# Patient Record
Sex: Female | Born: 1970 | Race: White | Hispanic: No | Marital: Married | Smoking: Never smoker
Health system: Southern US, Community
[De-identification: ages and names within clinical notes are randomized; demographics above are authoritative.]

## PROBLEM LIST (undated history)

## (undated) DIAGNOSIS — E119 Type 2 diabetes mellitus without complications: Secondary | ICD-10-CM

## (undated) DIAGNOSIS — E039 Hypothyroidism, unspecified: Secondary | ICD-10-CM

## (undated) DIAGNOSIS — K37 Unspecified appendicitis: Secondary | ICD-10-CM

## (undated) DIAGNOSIS — T7840XA Allergy, unspecified, initial encounter: Secondary | ICD-10-CM

## (undated) DIAGNOSIS — E310 Autoimmune polyglandular failure: Secondary | ICD-10-CM

## (undated) DIAGNOSIS — E559 Vitamin D deficiency, unspecified: Secondary | ICD-10-CM

## (undated) DIAGNOSIS — E785 Hyperlipidemia, unspecified: Secondary | ICD-10-CM

## (undated) HISTORY — DX: Unspecified appendicitis: K37

## (undated) HISTORY — DX: Autoimmune polyglandular failure: E31.0

## (undated) HISTORY — PX: ENDOMETRIAL ABLATION: SHX621

## (undated) HISTORY — DX: Vitamin D deficiency, unspecified: E55.9

## (undated) HISTORY — PX: LAPAROSCOPY: SHX197

## (undated) HISTORY — DX: Allergy, unspecified, initial encounter: T78.40XA

## (undated) HISTORY — DX: Type 2 diabetes mellitus without complications: E11.9

## (undated) HISTORY — DX: Hyperlipidemia, unspecified: E78.5

## (undated) HISTORY — DX: Hypothyroidism, unspecified: E03.9

---

## 1998-08-17 ENCOUNTER — Emergency Department (HOSPITAL_COMMUNITY): Admission: EM | Admit: 1998-08-17 | Discharge: 1998-08-17 | Payer: Self-pay | Admitting: Emergency Medicine

## 1998-08-17 ENCOUNTER — Encounter: Payer: Self-pay | Admitting: Emergency Medicine

## 1998-10-05 ENCOUNTER — Other Ambulatory Visit: Admission: RE | Admit: 1998-10-05 | Discharge: 1998-10-05 | Payer: Self-pay | Admitting: *Deleted

## 1999-10-11 ENCOUNTER — Other Ambulatory Visit: Admission: RE | Admit: 1999-10-11 | Discharge: 1999-10-11 | Payer: Self-pay | Admitting: *Deleted

## 2000-08-10 ENCOUNTER — Encounter (INDEPENDENT_AMBULATORY_CARE_PROVIDER_SITE_OTHER): Payer: Self-pay

## 2000-08-10 ENCOUNTER — Other Ambulatory Visit: Admission: RE | Admit: 2000-08-10 | Discharge: 2000-08-10 | Payer: Self-pay | Admitting: *Deleted

## 2000-10-11 ENCOUNTER — Other Ambulatory Visit: Admission: RE | Admit: 2000-10-11 | Discharge: 2000-10-11 | Payer: Self-pay | Admitting: *Deleted

## 2000-12-28 ENCOUNTER — Other Ambulatory Visit: Admission: RE | Admit: 2000-12-28 | Discharge: 2000-12-28 | Payer: Self-pay | Admitting: *Deleted

## 2001-10-15 ENCOUNTER — Other Ambulatory Visit: Admission: RE | Admit: 2001-10-15 | Discharge: 2001-10-15 | Payer: Self-pay | Admitting: *Deleted

## 2002-12-17 ENCOUNTER — Encounter: Admission: RE | Admit: 2002-12-17 | Discharge: 2002-12-17 | Payer: Self-pay | Admitting: Obstetrics and Gynecology

## 2003-03-01 ENCOUNTER — Inpatient Hospital Stay (HOSPITAL_COMMUNITY): Admission: AD | Admit: 2003-03-01 | Discharge: 2003-03-03 | Payer: Self-pay | Admitting: Obstetrics and Gynecology

## 2003-03-30 ENCOUNTER — Other Ambulatory Visit: Admission: RE | Admit: 2003-03-30 | Discharge: 2003-03-30 | Payer: Self-pay | Admitting: Obstetrics & Gynecology

## 2004-07-25 ENCOUNTER — Other Ambulatory Visit: Admission: RE | Admit: 2004-07-25 | Discharge: 2004-07-25 | Payer: Self-pay | Admitting: *Deleted

## 2005-09-11 ENCOUNTER — Other Ambulatory Visit: Admission: RE | Admit: 2005-09-11 | Discharge: 2005-09-11 | Payer: Self-pay | Admitting: *Deleted

## 2007-04-09 ENCOUNTER — Inpatient Hospital Stay (HOSPITAL_COMMUNITY): Admission: AD | Admit: 2007-04-09 | Discharge: 2007-04-12 | Payer: Self-pay | Admitting: Obstetrics and Gynecology

## 2007-06-13 ENCOUNTER — Ambulatory Visit (HOSPITAL_COMMUNITY): Admission: RE | Admit: 2007-06-13 | Discharge: 2007-06-13 | Payer: Self-pay | Admitting: Family Medicine

## 2007-06-14 ENCOUNTER — Inpatient Hospital Stay (HOSPITAL_COMMUNITY): Admission: EM | Admit: 2007-06-14 | Discharge: 2007-06-17 | Payer: Self-pay | Admitting: Emergency Medicine

## 2007-07-17 HISTORY — PX: APPENDECTOMY: SHX54

## 2007-08-09 ENCOUNTER — Encounter (INDEPENDENT_AMBULATORY_CARE_PROVIDER_SITE_OTHER): Payer: Self-pay | Admitting: Surgery

## 2007-08-09 ENCOUNTER — Ambulatory Visit (HOSPITAL_COMMUNITY): Admission: RE | Admit: 2007-08-09 | Discharge: 2007-08-10 | Payer: Self-pay | Admitting: Surgery

## 2007-08-30 IMAGING — CT CT ABCESS DRAINAGE
1 of 3 series · 12 of 32 positions shown, 18 images · non-contrast
Comparison: CT of the abdomen and pelvis of 06/13/07.

CLINICAL DATA: Perforated appendicitis with abscess located in the deep midline pelvis within the peritoneal cavity.  The patient now requires percutaneous drainage.
 CT GUIDED DRAINAGE OF PERITONEAL ABSCESS:

[Series 2: pel 5.0 b40f st · axial · 0.78mm/px · z∈[-318,-208]mm · 12 of 26 slices shown, 18 images]
[im 2/26  soft-tissue]
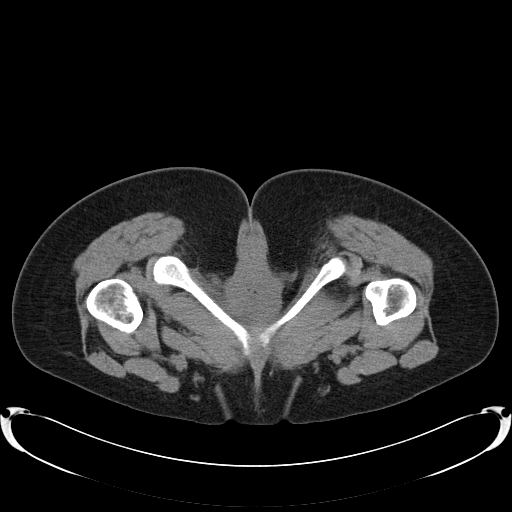
[im 2/26  bone]
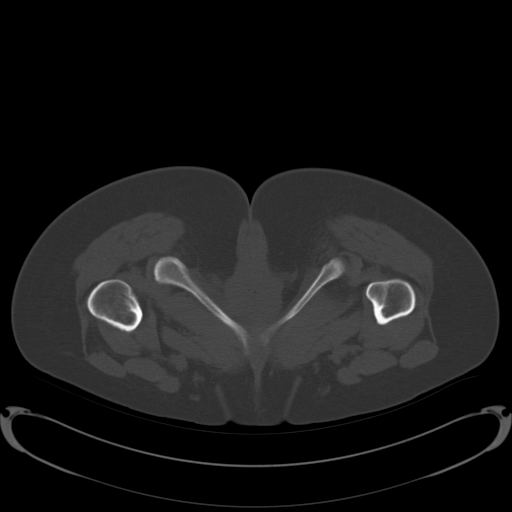
[im 4/26  soft-tissue]
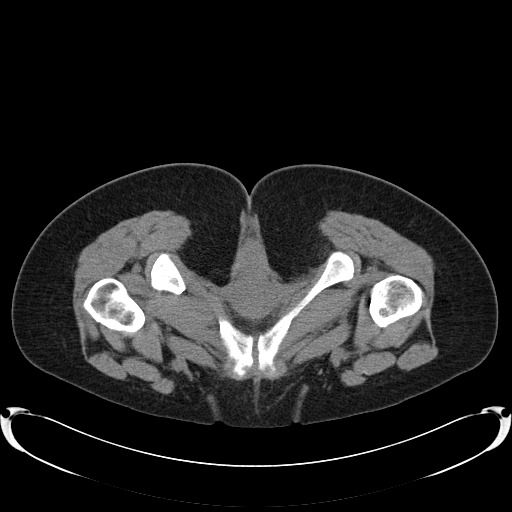
[im 6/26  soft-tissue]
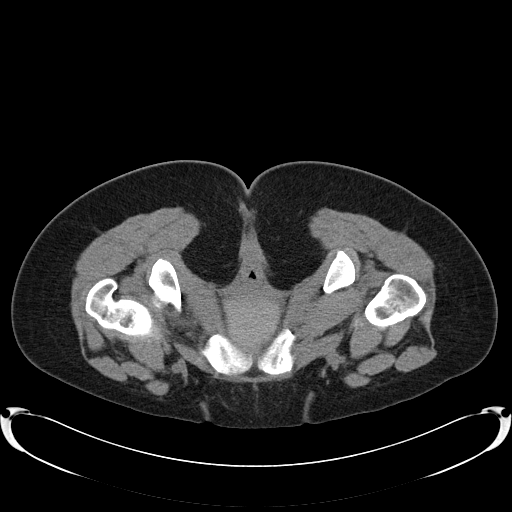
[im 8/26  soft-tissue]
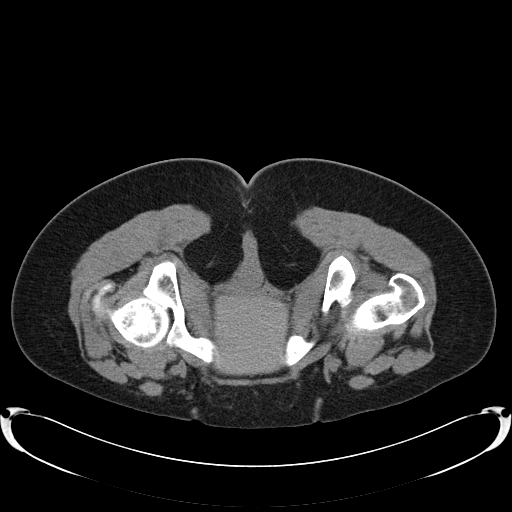
[im 10/26  soft-tissue]
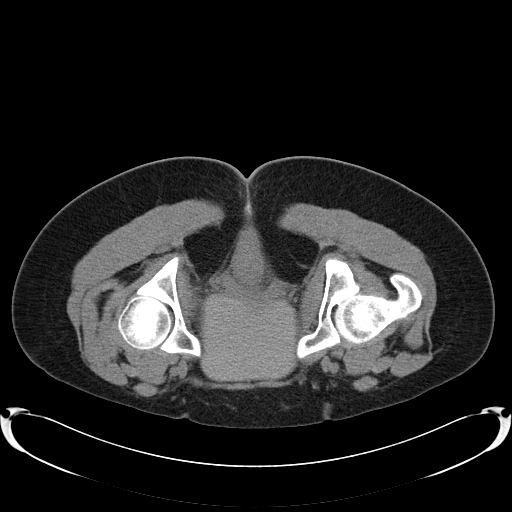
[im 12/26  soft-tissue]
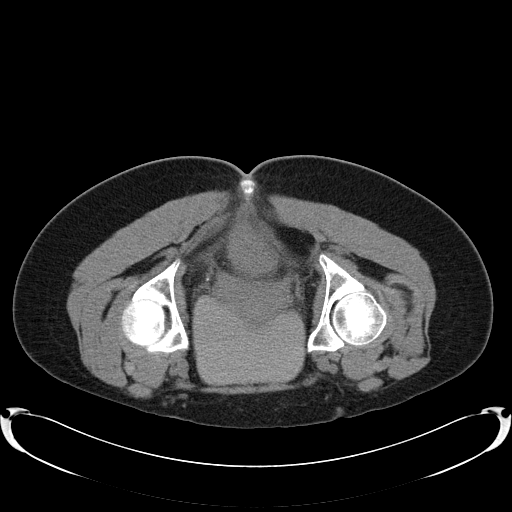
[im 14/26  soft-tissue]
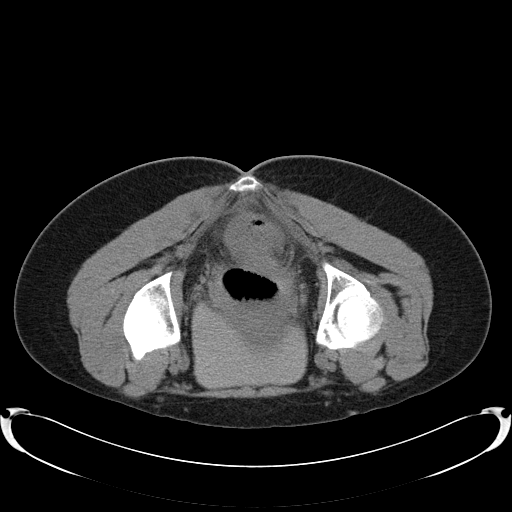
[im 16/26  soft-tissue]
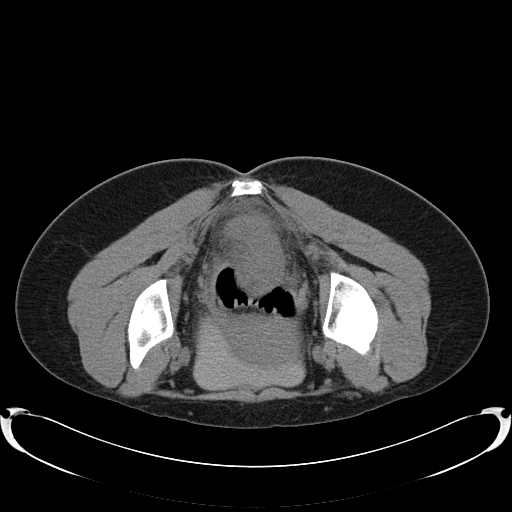
[im 18/26  soft-tissue]
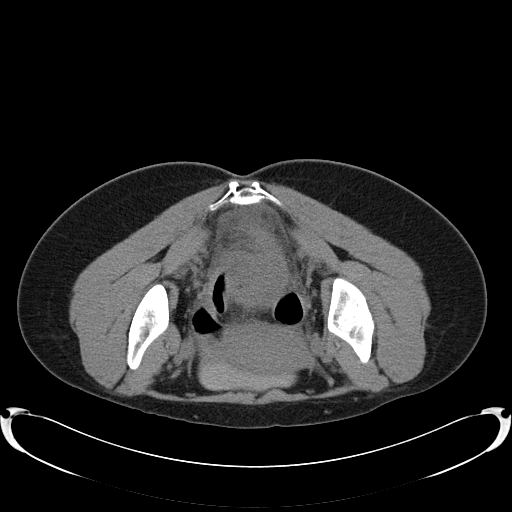
[im 18/26  lung]
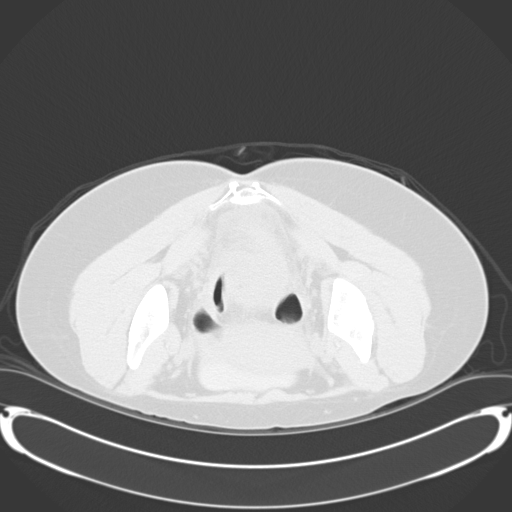
[im 18/26  bone]
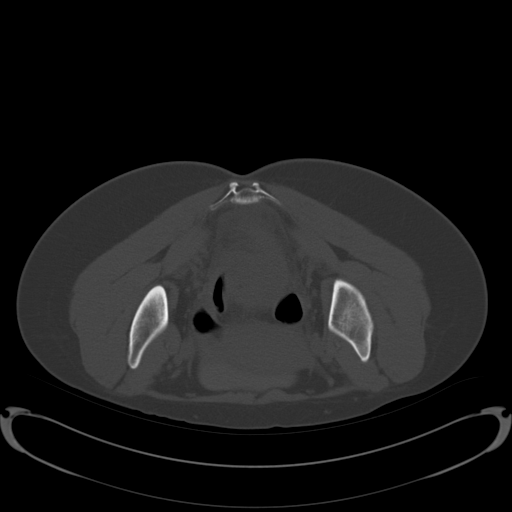
[im 20/26  soft-tissue]
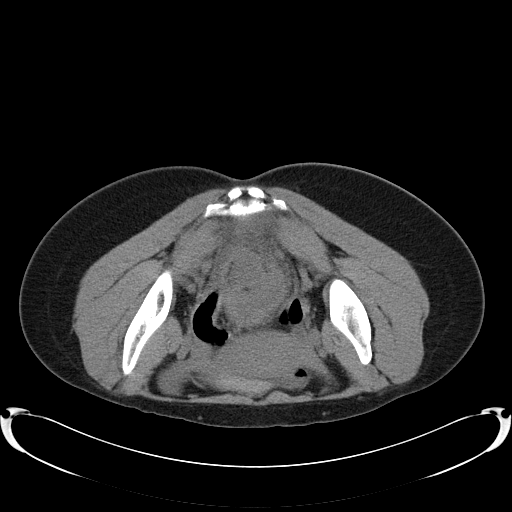
[im 20/26  lung]
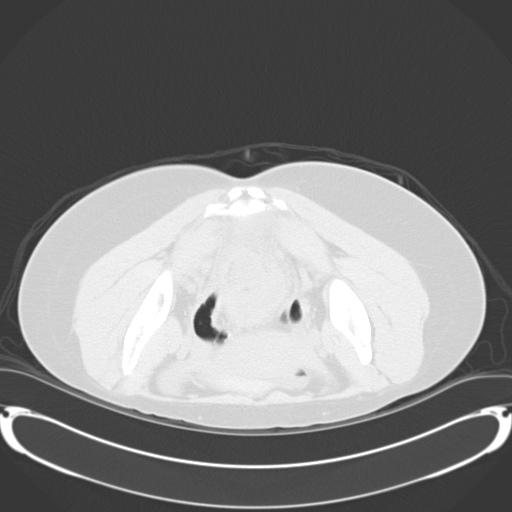
[im 22/26  soft-tissue]
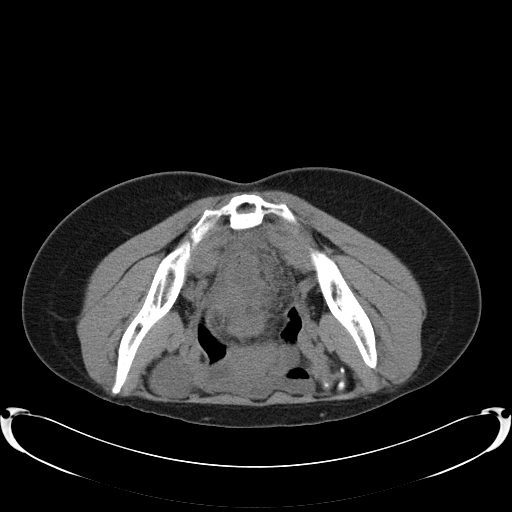
[im 22/26  lung]
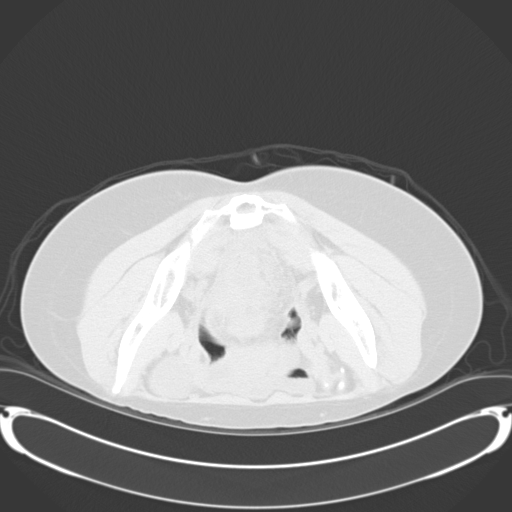
[im 24/26  soft-tissue]
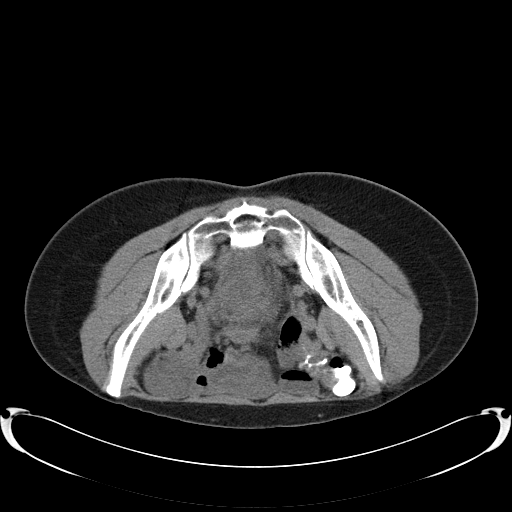
[im 24/26  lung]
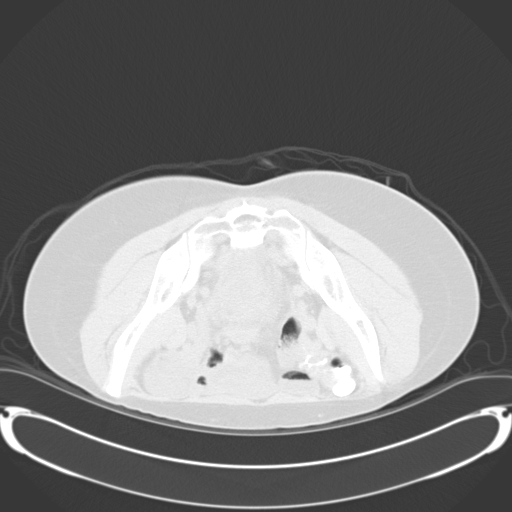

[12 of 32 positions shown; findings below may reference images not displayed]

Consent:  Prior to the procedure, informed consent was obtained.
 Sedation:  4 mg IV Versed, 100 mcg IV Fentanyl.
 Total Monitored Sedation Time:  45 minutes.
 Procedure:  The patient was placed in a prone position.  Preliminary unenhanced CT was performed through the pelvis.  The left transgluteal region was chosen for access and the skin sterilely prepped and draped.  Local anesthesia was provided with 1% lidocaine.  
 An 18-gauge trocar needle was advanced in an oblique fashion to the deep midline pelvic abscess.  After confirming needle tip position, aspiration was performed and a sample of liquid collected for culture studies.  A guidewire was then advanced through the needle and into the collection.  Over the guidewire, the tract was dilated to 12-French, and a 12-French pigtail abscess drainage catheter placed.  Final catheter position was confirmed by CT.  The catheter was flushed with saline and connected to Kiri Jim suction bulb.  It was secured at the skin with a Prolene retention suture and adhesive retention device.
 Complications:  None.
FINDINGS: Left transgluteal window was present just adjacent to the lower sacrum to gain access into the lower portion of the abscess cavity anterior to the rectum.  After needle placement, aspiration yielded grossly purulent fluid.  A sample was sent for culture studies. The 12-French catheter was able to be formed directly in the midline collection.  Output after catheter placement was bloody.  The tube will be flushed every eight hours and output followed.
IMPRESSION: CT guided drainage of deep peritoneal abscess located in the lower midline pelvis.  A sample of grossly purulent fluid was sent for culture studies.  A 12-French drain was placed into the collection.

## 2008-07-09 ENCOUNTER — Other Ambulatory Visit: Admission: RE | Admit: 2008-07-09 | Discharge: 2008-07-09 | Payer: Self-pay | Admitting: Gynecology

## 2011-02-28 NOTE — Op Note (Signed)
Veronica Hale, Veronica Hale               ACCOUNT NO.:  [id number]   MEDICAL RECORD NO.:  [id number]          PATIENT TYPE:  OIB   LOCATION:  1535                         FACILITY:  [facility]   PHYSICIAN:  Thornton Park. Daphine Deutscher, MD  DATE OF BIRTH:  [birth year]   DATE OF PROCEDURE:  08/09/2007  DATE OF DISCHARGE:  08/10/2007                               OPERATIVE REPORT   CCS NUMBER:  [id number].   PREOPERATIVE DIAGNOSIS:  Veronica Hale is a 40 year old white female who had  suffered a perforated appendix requiring percutaneous drainage in August  2008.  She is brought at this time for interval appendectomy.   PROCEDURE:  Laparoscopic appendectomy for perforated appendix.   SURGEON:  Thornton Park. Daphine Deutscher, MD   ANESTHESIA:  General endotracheal.   DESCRIPTION OF PROCEDURE:  The patient was taken to room #10 in [facility] and given general anesthesia.  Preoperatively she received a bowel  prep and she had Mefoxin.  I put a standard Hasson through the  umbilicus, a 10/11 through the left lower quadrant and a 5 in the right  upper quadrant.  The appendix was long and was stuck down into the  retroperitoneum on the right side.  I teased that away and separated it,  and came apart and obviously in the part of the perforation the abscess  was, and I dug out the tip from the retroperitoneum and teased that away  and put it in a bag and removed it first and then I isolated the base of  the appendix, went across it with an Endo-GIA vascular load and then I  went through the mesentery with a harmonic scalpel.  There was one  bleeder at the base the appendix, which was controlled with couple of  Endoclips, and I irrigated and no bleeding was noted.  I then repaired  the umbilical defect with three sutures of 0 Vicryl and then injected  all the ports with Marcaine and deflated the abdomen and closed the  wounds with 4-0 Vicryl with Benzoin and Steri-Strips.  The patient  tolerated the procedure well and was taken to the  recovery room in  satisfactory condition.      Thornton Park Daphine Deutscher, MD  Electronically Signed     MBM/MEDQ  D:  08/09/2007  T:  08/10/2007  Job:  [id number]   cc:   Alfonse Alpers. Dagoberto Ligas, M.D.  Fax: [phone]   Almedia Balls. Randell Patient, M.D.  Fax: [phone]

## 2011-03-03 NOTE — Discharge Summary (Signed)
Veronica Hale, Veronica Hale               ACCOUNT NO.:  [id number]   MEDICAL RECORD NO.:  [id number]          PATIENT TYPE:  INP   LOCATION:  1408                         FACILITY:  [facility]   PHYSICIAN:  Thornton Park. Daphine Deutscher, MD  DATE OF BIRTH:  [birth year]   DATE OF ADMISSION:  06/14/2007  DATE OF DISCHARGE:  06/17/2007                               DISCHARGE SUMMARY   CHIEF COMPLAINT:  Ruptured appendix with abscess   PROCEDURE:  Percutaneous drainage CT guidance the drains placed for  flushes q.8 h.   HISTORY:  This  is a 40 year old white female who is breast feeding and  is two months status post vaginal delivery who came with a 2-week  history of not feeling well and abdominal pain.  She is on an insulin  pump managed by Dr. Anson Fret and presented to the emergency room  febrile and with the above-mentioned CT scan.   COURSE IN HOSPITAL:  The patient underwent drainage by interventional  radiology, and she improved.  Diabetes coordinator saw her.  Temperature  gradually went down to normal. White count came down. Drainage  decreased.  Abdomen was benign, minimal Jackson-Pratt drainage.  Drainage was serosanguineous at the time of discharge home on Augmentin.  Follow up with me in 1-2 weeks.   FINAL DIAGNOSIS:  Perforated appendicitis.      Thornton Park Daphine Deutscher, MD  Electronically Signed     MBM/MEDQ  D:  07/16/2007  T:  07/16/2007  Job:  [id number]   cc:   Alfonse Alpers. Dagoberto Ligas, M.D.  Fax: [phone]

## 2011-07-28 LAB — URINALYSIS, ROUTINE W REFLEX MICROSCOPIC
Glucose, UA: NEGATIVE
Hgb urine dipstick: NEGATIVE
Ketones, ur: >80 — AB
Leukocytes, UA: NEGATIVE
Protein, ur: 30 — AB
Urobilinogen, UA: 0.2

## 2011-07-28 LAB — BASIC METABOLIC PANEL
BUN: 4 — ABNORMAL LOW
BUN: 6
Creatinine, Ser: 0.56
Creatinine, Ser: 0.98
GFR calc Af Amer: >60
GFR calc Af Amer: >60
GFR calc non Af Amer: >60
GFR calc non Af Amer: >60
Potassium: 3.7
Potassium: 4.3

## 2011-07-28 LAB — COMPREHENSIVE METABOLIC PANEL
ALT: 18
AST: 18
Albumin: 2.7 — ABNORMAL LOW
BUN: 7
CO2: 16 — ABNORMAL LOW
Chloride: 99
GFR calc non Af Amer: >60
Glucose, Bld: 117 — ABNORMAL HIGH
Potassium: 3.2 — ABNORMAL LOW

## 2011-07-28 LAB — CBC
HCT: 32.5 — ABNORMAL LOW
Hemoglobin: 11.2 — ABNORMAL LOW
Platelets: 422 — ABNORMAL HIGH
WBC: 13.3 — ABNORMAL HIGH
WBC: 8

## 2011-07-28 LAB — CULTURE, ROUTINE-ABSCESS

## 2011-07-28 LAB — DIFFERENTIAL
Eosinophils Absolute: 0
Eosinophils Relative: 0
Lymphocytes Relative: 7 — ABNORMAL LOW
Lymphs Abs: 1
Monocytes Absolute: 1.2 — ABNORMAL HIGH
Monocytes Relative: 9

## 2011-07-28 LAB — CULTURE, BLOOD (ROUTINE X 2)

## 2011-07-28 LAB — KETONES, QUALITATIVE

## 2011-07-28 LAB — KETONES, URINE: Ketones, ur: >80 — AB

## 2011-07-28 LAB — ANAEROBIC CULTURE

## 2011-08-02 LAB — CBC
HCT: 33.4 — ABNORMAL LOW
HCT: 37
Hemoglobin: 11.1 — ABNORMAL LOW
Hemoglobin: 12.3
MCV: 79.8
RBC: 4.19
RBC: 4.66
WBC: 12.8 — ABNORMAL HIGH
WBC: 8.8

## 2011-08-02 LAB — DIFFERENTIAL
Eosinophils Relative: 1
Lymphocytes Relative: 17
Lymphs Abs: 1.5
Monocytes Absolute: 0.7
Monocytes Relative: 8

## 2011-08-02 LAB — RAPID HIV SCREEN (WH-MAU): Rapid HIV Screen: NONREACTIVE

## 2015-03-04 ENCOUNTER — Other Ambulatory Visit: Payer: Self-pay | Admitting: Neurological Surgery

## 2015-03-04 DIAGNOSIS — M5412 Radiculopathy, cervical region: Secondary | ICD-10-CM

## 2015-03-08 ENCOUNTER — Ambulatory Visit
Admission: RE | Admit: 2015-03-08 | Discharge: 2015-03-08 | Disposition: A | Discharge status: Home / Self Care | Payer: 59 | Source: Ambulatory Visit | Attending: Neurological Surgery | Admitting: Neurological Surgery

## 2015-03-08 DIAGNOSIS — M5412 Radiculopathy, cervical region: Secondary | ICD-10-CM

## 2015-03-08 MED ORDER — TRIAMCINOLONE ACETONIDE 40 MG/ML IJ SUSP (RADIOLOGY)
40.0000 mg | Freq: Once | INTRAMUSCULAR | Status: AC
  Administered 2015-03-08: 40 mg via EPIDURAL

## 2015-03-08 MED ORDER — IOHEXOL 300 MG/ML  SOLN
1.0000 mL | Freq: Once | INTRAMUSCULAR | Status: AC | PRN
  Administered 2015-03-08: 1 mL via EPIDURAL

## 2015-03-08 NOTE — Discharge Instructions (Signed)

## 2015-07-13 ENCOUNTER — Ambulatory Visit (INDEPENDENT_AMBULATORY_CARE_PROVIDER_SITE_OTHER): Payer: Commercial Managed Care - HMO | Admitting: Urgent Care

## 2015-07-13 VITALS — BP 108/76 | HR 69 | Temp 98.6°F | Resp 18 | Ht 63.0 in | Wt 141.0 lb

## 2015-07-13 DIAGNOSIS — Z111 Encounter for screening for respiratory tuberculosis: Secondary | ICD-10-CM | POA: Diagnosis not present

## 2015-07-13 NOTE — Patient Instructions (Addendum)
- Please return within 48-72 hours for your reading. Otherwise it is not valid.  Tuberculin Skin Test The PPD skin test is a method used to help with the diagnosis of a disease called tuberculosis (TB). HOW THE TEST IS DONE  The test site (usually the forearm) is cleansed. The PPD extract is then injected under the top layer of skin, causing a blister to form on the skin. The reaction will take 48 - 72 hours to develop. You must return to your health care Veronica Hale within that time to have the area checked. This will determine whether you have had a significant reaction to the PPD test. A reaction is measured in millimeters of hard swelling (induration) at the site. PREPARATION FOR TEST  There is no special preparation for this test. People with a skin rash or other skin irritations on their arms may need to have the test performed at a different spot on the body. Tell your health care Veronica Hale if you have ever had a positive PPD skin test. If so, you should not have a repeat PPD test. Tell your doctor if you have a medical condition or if you take certain drugs, such as steroids, that can affect your immune system. These situations may lead to inaccurate test results. NORMAL FINDINGS A negative reaction (no induration) or a level of hard swelling that falls below a certain cutoff may mean that a person has not been infected with the bacteria that cause TB. There are different cutoffs for children, people with HIV, and other risk groups. Unfortunately, this is not a perfect test, and up to 20% of people infected with tuberculosis may not have a reaction on the PPD skin test. In addition, certain conditions that affect the immune system (cancer, recent chemotherapy, late-stage AIDS) may cause a false-negative test result.  The reaction will take 48 - 72 hours to develop. You must return to your health care Veronica Hale within that time to have the area checked. Follow your caregiver's instructions as to  where and when to report for this to be done. Ranges for normal findings may vary among different laboratories and hospitals. You should always check with your doctor after having lab work or other tests done to discuss the meaning of your test results and whether your values are considered within normal limits. WHAT ABNORMAL RESULTS MEAN  The results of the test depend on the size of the skin reaction and on the person being tested.  A small reaction (5 mm of hard swelling at the site) is considered to be positive in people who have HIV, who are taking steroid therapy, or who have been in close contact with a person who has active tuberculosis. Larger reactions (greater than or equal to 10 mm) are considered positive in people with diabetes or kidney failure, and in health care workers, among others. In people with no known risks for tuberculosis, a positive reaction requires 15 mm or more of hard swelling at the site. RISKS AND COMPLICATIONS There is a very small risk of severe redness and swelling of the arm in people who have had a previous positive PPD test and who have the test again. There also have been a few rare cases of this reaction in people who have not been tested before. CONSIDERATIONS  A positive skin test does not necessarily mean that a person has active tuberculosis. More tests will be done to check whether active disease is present. Many people who were born outside the [place] may have had a vaccine called "BCG," which can lead to a false-positive test result. MEANING OF TEST  Your caregiver will go over the test results with you and discuss the importance and meaning of your results, as well as treatment options and the need for additional tests if necessary. OBTAINING THE TEST RESULTS It is your responsibility to obtain your test results. Ask the lab or department performing the test when and how you will get your results. Document Released: 07/12/2005 Document Revised:  12/25/2011 Document Reviewed: 01/09/2014 [facility] Patient Information 2015 [place], [place]. This information is not intended to replace advice given to you by your health care Veronica Hale. Make sure you discuss any questions you have with your health care Veronica Hale.

## 2015-07-13 NOTE — Progress Notes (Signed)
    MRN: [id number] DOB: Jan 31, 1971  Subjective:   Veronica Hale is a 44 y.o. female presenting for chief complaint of TB Skin Test  Patient is planning on becoming a substitute teacher and is required to have a tuberculosis screen with PPD test. She has never tested positive for a PPD test. Has very low risk factors as seen on her tuberculosis screening questionnaire. She plans on having her form filled out by her physician who did an exam with her. He would like to have these results printed out on Friday. Denies any other aggravating or relieving factors, no other questions or concerns.  Veronica Hale has a current medication list which includes the following prescription(s): atorvastatin and insulin pump. Also is allergic to sulfa antibiotics.  Veronica Hale  has a past medical history of Allergy and Diabetes mellitus without complication. Also  has past surgical history that includes Appendectomy.  Objective:   Vitals: BP 108/76 mmHg  Pulse 69  Temp(Src) 98.6 F (37 C)  Resp 18  Ht  (1.6 m)  Wt 141 lb (63.957 kg)  BMI 24.98 kg/m2  SpO2 98%  LMP 07/05/2015 (Approximate)  Physical Exam  Constitutional: She is oriented to person, place, and time. She appears well-developed and well-nourished.  Cardiovascular: Normal rate.   Pulmonary/Chest: Effort normal.  Neurological: She is alert and oriented to person, place, and time.  Psychiatric: She has a normal mood and affect.   Assessment and Plan :   1. Tuberculosis screening - TB Skin Test applied today. Patient to return in 48-72 hours for reading.   Wallis Bamberg, PA-C [facility] [phone] 07/13/2015 5:42 PM

## 2015-07-13 NOTE — Progress Notes (Signed)
  Tuberculosis Risk Questionnaire  1. No Were you born outside the [place] in one of the following parts of the world: [place], [place], Central [place], [place] America or Eastern [place]?    2. No Have you traveled outside the [place] and lived for more than one month in one of the following parts of the world: [place], [place], [place], [place] or Eastern [place]?    3. Yes  Do you have a compromised immune system such as from any of the following conditions:HIV/AIDS, organ or bone marrow transplantation, diabetes, immunosuppressive medicines (e.g. Prednisone, Remicaide), leukemia, lymphoma, cancer of the head or neck, gastrectomy or jejunal bypass, end-stage renal disease (on dialysis), or silicosis?     4. No Have you ever or do you plan on working in: a residential care center, a health care facility, a jail or prison or homeless shelter?    5. No Have you ever: injected illegal drugs, used crack cocaine, lived in a homeless shelter  or been in jail or prison?     6. No Have you ever been exposed to anyone with infectious tuberculosis?    Tuberculosis Symptom Questionnaire  Do you currently have any of the following symptoms?  1. No Unexplained cough lasting more than 3 weeks?   2. No Unexplained fever lasting more than 3 weeks.   3. No Night Sweats (sweating that leaves the bedclothes and sheets wet)     4. No Shortness of Breath   5. No Chest Pain   6. No Unintentional weight loss    7. No Unexplained fatigue (very tired for no reason)    

## 2015-07-16 DIAGNOSIS — Z111 Encounter for screening for respiratory tuberculosis: Secondary | ICD-10-CM

## 2015-07-16 LAB — TB SKIN TEST: TB SKIN TEST: NEGATIVE

## 2016-08-10 ENCOUNTER — Encounter: Payer: Self-pay | Admitting: Internal Medicine

## 2016-10-18 ENCOUNTER — Ambulatory Visit (INDEPENDENT_AMBULATORY_CARE_PROVIDER_SITE_OTHER): Payer: 59 | Admitting: Internal Medicine

## 2016-10-18 ENCOUNTER — Encounter (INDEPENDENT_AMBULATORY_CARE_PROVIDER_SITE_OTHER): Payer: Self-pay

## 2016-10-18 ENCOUNTER — Encounter: Payer: Self-pay | Admitting: Internal Medicine

## 2016-10-18 VITALS — BP 102/64 | HR 88 | Ht 62.25 in | Wt 139.5 lb

## 2016-10-18 DIAGNOSIS — R197 Diarrhea, unspecified: Secondary | ICD-10-CM | POA: Diagnosis not present

## 2016-10-18 MED ORDER — NA SULFATE-K SULFATE-MG SULF 17.5-3.13-1.6 GM/177ML PO SOLN: 1.0000 | Freq: Once | ORAL | 0 refills | Status: AC

## 2016-10-18 NOTE — Progress Notes (Signed)
HISTORY OF PRESENT ILLNESS:  Veronica Hale is a 46 y.o. female with a 12 year history of type 1 diabetes mellitus, hyperlipidemia, and hypothyroidism who is referred by her primary care provider Dr. Evlyn KannerSouth with chief complaint of chronic diarrhea. Outside records reviewed. Patient denies prior history of GI problems or GI evaluations. She reports abruptly developing problems with diarrhea after eating a bagel with cream cheese in October 2016. Prior to that occasion, she describes about 2 formed bowel movements daily. Thereafter, she describes 4-5 loose or very soft stools 4-5 times per day. There is associated urgency. 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 She denies that her symptoms are related to meals. There is no steatorrhea. No nocturnal component. No weight loss. No bleeding. She does not go a single day without a loose bowel movement. No over-the-counter medications, new medications, or sugar-free products consumption. Her gallbladder is intact. Evaluation with her PCP revealed negative celiac testing. A trial of Creon was not helpful. She also tried fiber, caffeine elimination, and probiotics without improvement. On rare occasion she has taken 1 or 2 Imodium in the morning which seemed to slow things down. There was some abdominal upset with Imodium. Her hemoglobin A1c's have been good. She uses insulin pump.  REVIEW OF SYSTEMS:  All non-GI ROS negative except for fatigue, sinus and allergy trouble  Past Medical History:  Diagnosis Date  . Allergy   . Appendicitis   . Diabetes mellitus without complication (HCC)   . Hyperlipidemia   . Hypothyroid   . Polyglandular autoimmune syndrome (HCC)   . Vitamin D deficiency     Past Surgical History:  Procedure Laterality Date  . APPENDECTOMY  07/2007  . ENDOMETRIAL ABLATION     uterine ablation  . LAPAROSCOPY     x2, ovarian cyst removal    Social History Veronica CollumLisa A Norem  reports that she has  never smoked. She has never used smokeless tobacco. She reports that she drinks alcohol. She reports that she does not use drugs.  family history includes Breast cancer in her maternal grandmother; Colon polyps in her father and mother; Diabetes in her maternal grandfather; Heart disease in her maternal grandfather, maternal grandmother, and paternal grandfather; Hyperlipidemia in her father and mother; Irritable bowel syndrome in her mother; Skin cancer in her mother.  Allergies  Allergen Reactions  . Sulfa Antibiotics Rash    Occurred while pregnant and caused rash all over body.       PHYSICAL EXAMINATION: Vital signs: BP 102/64 (BP Location: Left Arm, Patient Position: Sitting, Cuff Size: Normal)   Pulse 88   Ht 5' 2.25" (1.581 m) Comment: height measured without shoes  Wt 139 lb 8 oz (63.3 kg)   LMP 10/04/2016   BMI 25.31 kg/m   Constitutional: generally well-appearing, no acute distress Psychiatric: alert and oriented x3, cooperative Eyes: extraocular movements intact, anicteric, conjunctiva pink Mouth: oral pharynx moist, no lesions Neck: supple no lymphadenopathy Cardiovascular: heart regular rate and rhythm, no murmur Lungs: clear to auscultation bilaterally Abdomen: soft, nontender, nondistended, no obvious ascites, no peritoneal signs, normal bowel sounds, no organomegaly Rectal:Deferred until colonoscopy Extremities: no clubbing cyanosis or lower extremity edema bilaterally Skin: no lesions on visible extremities Neuro: No focal deficits. Cranial nerves intact  ASSESSMENT:  #241. 46 year old type I diabetic with a 14 month history of persistent loose stools. Negative evaluation to date as outlined and lack of response to mentioned therapies. Possible causes include microscopic colitis,  bile salt related diarrhea, and postinfectious IBS. No alarm features (such as weight loss, bleeding, pain, and malabsorptive features) for more ominous diagnoses #2. Insulin requiring  diabetes mellitus  PLAN:  #1. Colonoscopy with biopsies.The nature of the procedure, as well as the risks, benefits, and alternatives were carefully and thoroughly reviewed with the patient. Ample time for discussion and questions allowed. The patient understood, was satisfied, and agreed to proceed. #2. If no evidence for microscopic colitis, trial of Colestid. Discussed #3. Office follow-up after the above.  A copy of this consultation note has been sent to Dr. Evlyn Kanner

## 2016-10-18 NOTE — Patient Instructions (Signed)

## 2016-10-20 ENCOUNTER — Ambulatory Visit (AMBULATORY_SURGERY_CENTER): Payer: 59 | Admitting: Internal Medicine

## 2016-10-20 ENCOUNTER — Encounter: Payer: Self-pay | Admitting: Internal Medicine

## 2016-10-20 VITALS — BP 119/81 | HR 90 | Temp 99.3°F | Resp 16 | Ht 62.25 in | Wt 139.0 lb

## 2016-10-20 DIAGNOSIS — R197 Diarrhea, unspecified: Secondary | ICD-10-CM

## 2016-10-20 DIAGNOSIS — K6389 Other specified diseases of intestine: Secondary | ICD-10-CM | POA: Diagnosis not present

## 2016-10-20 MED ORDER — SODIUM CHLORIDE 0.9 % IV SOLN: 500.0000 mL | INTRAVENOUS | Status: AC

## 2016-10-20 NOTE — Patient Instructions (Signed)
YOU HAD AN ENDOSCOPIC PROCEDURE TODAY AT THE [facility]:   Refer to the procedure report that was given to you for any specific questions about what was found during the examination.  If the procedure report does not answer your questions, please call your gastroenterologist to clarify.  If you requested that your care partner not be given the details of your procedure findings, then the procedure report has been included in a sealed envelope for you to review at your convenience later.  YOU SHOULD EXPECT: Some feelings of bloating in the abdomen. Passage of more gas than usual.  Walking can help get rid of the air that was put into your GI tract during the procedure and reduce the bloating. If you had a lower endoscopy (such as a colonoscopy or flexible sigmoidoscopy) you may notice spotting of blood in your stool or on the toilet paper. If you underwent a bowel prep for your procedure, you may not have a normal bowel movement for a few days.  Please Note:  You might notice some irritation and congestion in your nose or some drainage.  This is from the oxygen used during your procedure.  There is no need for concern and it should clear up in a day or so.  SYMPTOMS TO REPORT IMMEDIATELY:   Following lower endoscopy (colonoscopy or flexible sigmoidoscopy):  Excessive amounts of blood in the stool  Significant tenderness or worsening of abdominal pains  Swelling of the abdomen that is new, acute  Fever of 100F or higher   For urgent or emergent issues, a gastroenterologist can be reached at any hour by calling [phone].   DIET:  We do recommend a small meal at first, but then you may proceed to your regular diet.  Drink plenty of fluids but you should avoid alcoholic beverages for 24 hours.  ACTIVITY:  You should plan to take it easy for the rest of today and you should NOT DRIVE or use heavy machinery until tomorrow (because of the sedation medicines used during the test).     FOLLOW UP: Our staff will call the number listed on your records the next business day following your procedure to check on you and address any questions or concerns that you may have regarding the information given to you following your procedure. If we do not reach you, we will leave a message.  However, if you are feeling well and you are not experiencing any problems, there is no need to return our call.  We will assume that you have returned to your regular daily activities without incident.  If any biopsies were taken you will be contacted by phone or by letter within the next 1-3 weeks.  Please call us at [phone] if you have not heard about the biopsies in 3 weeks.    SIGNATURES/CONFIDENTIALITY: You and/or your care partner have signed paperwork which will be entered into your electronic medical record.  These signatures attest to the fact that that the information above on your After Visit Summary has been reviewed and is understood.  Full responsibility of the confidentiality of this discharge information lies with you and/or your care-partner.   Resume medications.  

## 2016-10-20 NOTE — Progress Notes (Signed)
Called to room to assist during endoscopic procedure.  Patient ID and intended procedure confirmed with present staff. Received instructions for my participation in the procedure from the performing physician.  

## 2016-10-20 NOTE — Op Note (Signed)
[facility] Patient Name: Veronica Hale Procedure Date: 10/20/2016 11:22 AM MRN: [id number] Endoscopist: Wilhemina BonitoJohn N. Marina GoodellPerry , MD Age: [id number] Referring MD:  Date of Birth: 1971-01-24 Gender: Female Account #: [id number] Procedure:                Colonoscopy, with biopsies Indications:              Chronic diarrhea, unexplained Medicines:                Monitored Anesthesia Care Procedure:                Pre-Anesthesia Assessment:                           - Prior to the procedure, a History and Physical                            was performed, and patient medications and                            allergies were reviewed. The patient's tolerance of                            previous anesthesia was also reviewed. The risks                            and benefits of the procedure and the sedation                            options and risks were discussed with the patient.                            All questions were answered, and informed consent                            was obtained. Prior Anticoagulants: The patient has                            taken no previous anticoagulant or antiplatelet                            agents. ASA Grade Assessment: III - A patient with                            severe systemic disease. After reviewing the risks                            and benefits, the patient was deemed in                            satisfactory condition to undergo the procedure.                           After obtaining informed consent, the colonoscope  was passed under direct vision. Throughout the                            procedure, the patient's blood pressure, pulse, and                            oxygen saturations were monitored continuously. The                            Model CF-HQ190L [id number]) scope was introduced                            through the anus and advanced to the the cecum,                            identified by  appendiceal orifice and ileocecal                            valve. The terminal ileum, ileocecal valve,                            appendiceal orifice, and rectum were photographed.                            The quality of the bowel preparation was excellent.                            The colonoscopy was performed without difficulty.                            The patient tolerated the procedure well. The bowel                            preparation used was SUPREP. Scope In: 11:31:40 AM Scope Out: 11:41:08 AM Scope Withdrawal Time: 0 hours 7 minutes 24 seconds  Total Procedure Duration: 0 hours 9 minutes 28 seconds  Findings:                 The terminal ileum appeared normal.                           The entire examined colon appeared normal on direct                            and retroflexion views. Biopsies for histology were                            taken with a cold forceps from the entire colon for                            evaluation of microscopic colitis. Complications:            No immediate complications. Estimated blood loss:  None. Estimated Blood Loss:     Estimated blood loss: none. Impression:               - The examined portion of the ileum was normal.                           - The entire examined colon is normal on direct and                            retroflexion views. Recommendation:           - Repeat colonoscopy in 10 years for screening                            purposes.                           - Patient has a contact number available for                            emergencies. The signs and symptoms of potential                            delayed complications were discussed with the                            patient. Return to normal activities tomorrow.                            Written discharge instructions were provided to the                            patient.                           - Resume previous  diet.                           - Continue present medications.                           - Await pathology results. Further recommendations                            to follow. Wilhemina Bonito. Marina Goodell, MD 10/20/2016 11:48:03 AM This report has been signed electronically.

## 2016-10-20 NOTE — Progress Notes (Signed)
A/ox3 pleased with MAC, report to [facility]

## 2016-10-23 ENCOUNTER — Telehealth: Payer: Self-pay | Admitting: *Deleted

## 2016-10-23 NOTE — Telephone Encounter (Signed)
  Follow up Call-  Call back number 10/20/2016  Post procedure Call Back phone  # [phone]  Permission to leave phone message Yes  Some recent data might be hidden     Patient questions:  Do you have a fever, pain , or abdominal swelling? No. Pain Score  0 *  Have you tolerated food without any problems? Yes.    Have you been able to return to your normal activities? Yes.    Do you have any questions about your discharge instructions: Diet   No. Medications  No. Follow up visit  No.  Do you have questions or concerns about your Care? No.  Actions: * If pain score is 4 or above: No action needed, pain <4.

## 2016-10-26 ENCOUNTER — Other Ambulatory Visit: Payer: Self-pay

## 2016-10-26 ENCOUNTER — Telehealth: Payer: Self-pay

## 2016-10-26 ENCOUNTER — Encounter: Payer: Self-pay | Admitting: Internal Medicine

## 2016-10-26 MED ORDER — BUDESONIDE 3 MG PO CPEP: 9.0000 mg | ORAL_CAPSULE | Freq: Every day | ORAL | 3 refills | Status: DC

## 2016-10-26 NOTE — Telephone Encounter (Signed)
-----   Message from Veronica FredricksonJohn N [name], MD sent at 10/26/2016 12:02 PM EST ----- Regarding: microscopic colitis Let patient know bx show microscopic colitis. Start budesonide 9 mg daily. Follow up 4-6 weeks

## 2016-10-26 NOTE — Telephone Encounter (Signed)
Also review with pt that she needs to monitor her blood sugars as budesonide may increase her blood sugar.   Script sent in for budesonide. Pt scheduled to see Dr. Marina GoodellPerry 12/05/16@9 :15am.

## 2016-10-27 NOTE — Telephone Encounter (Signed)
Spoke with pt and she is aware.

## 2016-10-30 ENCOUNTER — Telehealth: Payer: Self-pay | Admitting: Internal Medicine

## 2016-10-30 NOTE — Telephone Encounter (Signed)
Let pt know she can take the dose in the morning.

## 2016-12-04 DIAGNOSIS — E109 Type 1 diabetes mellitus without complications: Secondary | ICD-10-CM | POA: Diagnosis not present

## 2016-12-05 ENCOUNTER — Ambulatory Visit (INDEPENDENT_AMBULATORY_CARE_PROVIDER_SITE_OTHER): Payer: 59 | Admitting: Internal Medicine

## 2016-12-05 ENCOUNTER — Encounter: Payer: Self-pay | Admitting: Internal Medicine

## 2016-12-05 ENCOUNTER — Encounter (INDEPENDENT_AMBULATORY_CARE_PROVIDER_SITE_OTHER): Payer: Self-pay

## 2016-12-05 VITALS — BP 104/68 | HR 80 | Ht 62.5 in | Wt 139.6 lb

## 2016-12-05 DIAGNOSIS — K52839 Microscopic colitis, unspecified: Secondary | ICD-10-CM | POA: Diagnosis not present

## 2016-12-05 DIAGNOSIS — K52832 Lymphocytic colitis: Secondary | ICD-10-CM

## 2016-12-05 DIAGNOSIS — R197 Diarrhea, unspecified: Secondary | ICD-10-CM

## 2016-12-05 MED ORDER — BUDESONIDE 3 MG PO CPEP: 9.0000 mg | ORAL_CAPSULE | Freq: Every day | ORAL | 6 refills | Status: AC

## 2016-12-05 NOTE — Patient Instructions (Signed)
Please follow up with Dr. Marina GoodellPerry on 01/31/2017 at 9:30am   We have sent the following medications to your pharmacy for you to pick up at your convenience:  Budesonide

## 2016-12-05 NOTE — Progress Notes (Signed)
HISTORY OF PRESENT ILLNESS:  Veronica Hale is a 46 y.o. female long-standing type I diabetic who was evaluated 10/18/2016 regarding a 15 month history of problems with persistent diarrhea. At that time described 4-5 loose bowel movements per day with urgency. Had undergone extensive workup prior to the visit which was negative, including testing for celiac disease and empiric therapies. Thus, she underwent complete colonoscopy 10/20/2016. The terminal ileum and colon were entirely normal. Random colon biopsies revealed colonic intraepithelial lymphocytosis consistent with lymphocytic colitis. She was placed on budesonide 9 mg daily and presents today for follow-up. Within 2 weeks the patient reports having had marked improvement in her symptoms. Currently describes 2-3 firmer bowel movements without urgency. She feels that she is 90% or more better. Slightly higher blood sugars in the morning. No other complaints. She has multiple appropriate questions.  REVIEW OF SYSTEMS:  All non-GI ROS negative except for sinus and allergy trouble  Past Medical History:  Diagnosis Date  . Allergy   . Appendicitis   . Diabetes mellitus without complication (HCC)   . Hyperlipidemia   . Hypothyroid   . Polyglandular autoimmune syndrome (HCC)   . Vitamin D deficiency     Past Surgical History:  Procedure Laterality Date  . APPENDECTOMY  07/2007  . ENDOMETRIAL ABLATION     uterine ablation  . LAPAROSCOPY     x2, ovarian cyst removal    Social History Veronica Hale  reports that she has never smoked. She has never used smokeless tobacco. She reports that she drinks alcohol. She reports that she does not use drugs.  family history includes Breast cancer in her maternal grandmother; Colon polyps in her father and mother; Diabetes in her maternal grandfather; Heart disease in her maternal grandfather, maternal grandmother, and paternal grandfather; Hyperlipidemia in her father and mother; Irritable bowel  syndrome in her mother; Skin cancer in her mother.  Allergies  Allergen Reactions  . Sulfa Antibiotics Rash    Occurred while pregnant and caused rash all over body.       PHYSICAL EXAMINATION: Vital signs: BP 104/68   Pulse 80   Ht 5' 2.5" (1.588 m)   Wt 139 lb 9.6 oz (63.3 kg)   LMP 11/16/2016   BMI 25.13 kg/m  General: Well-developed, well-nourished, no acute distress Abdomen: Not reexamined Psychiatric: alert and oriented x3. Cooperative   ASSESSMENT:  #1. Lymphocytic colitis. Improved on budesonide 9 mg daily  PLAN:  #1. Continue budesonide 9 mg daily #2. Office follow-up in about 2 months. If she continues to do well, we will begin tapering her therapy  #3. Contact the office in the interim for any questions or problems  20 minutes was spent face-to-face with the patient. Greater than 50% a time was used for counseling regarding microscopic colitis in general and lymphocytic colitis more specifically. We discussed the pathophysiology, treatment, and expected outcomes as well as follow-up. Multiple questions answered to her satisfaction

## 2016-12-21 DIAGNOSIS — K529 Noninfective gastroenteritis and colitis, unspecified: Secondary | ICD-10-CM | POA: Diagnosis not present

## 2016-12-21 DIAGNOSIS — E310 Autoimmune polyglandular failure: Secondary | ICD-10-CM | POA: Diagnosis not present

## 2016-12-21 DIAGNOSIS — Z1389 Encounter for screening for other disorder: Secondary | ICD-10-CM | POA: Diagnosis not present

## 2016-12-21 DIAGNOSIS — E109 Type 1 diabetes mellitus without complications: Secondary | ICD-10-CM | POA: Diagnosis not present

## 2017-01-31 ENCOUNTER — Ambulatory Visit (INDEPENDENT_AMBULATORY_CARE_PROVIDER_SITE_OTHER): Payer: 59 | Admitting: Internal Medicine

## 2017-01-31 ENCOUNTER — Encounter: Payer: Self-pay | Admitting: Internal Medicine

## 2017-01-31 VITALS — BP 108/60 | HR 76 | Ht 62.5 in | Wt 143.5 lb

## 2017-01-31 DIAGNOSIS — K52839 Microscopic colitis, unspecified: Secondary | ICD-10-CM | POA: Diagnosis not present

## 2017-01-31 NOTE — Patient Instructions (Addendum)
Per Dr. Marina Goodell, take 6 mg of Budesonide for one month; if tolerated, take 3 mg of Budesonide for one month; if tolerated, then stop.  Please follow up with Dr. Marina Goodell in 3 months

## 2017-01-31 NOTE — Progress Notes (Signed)
HISTORY OF PRESENT ILLNESS:  Veronica Hale is a 46 y.o. female with long-standing type 1 diabetes mellitus who was evaluated 10/18/2016 regarding a 15 month history of problems with persistent diarrhea. At that time she described 4-5 loose bowel movements per day with urgency. Previous extensive workup and treatment with empiric therapies was both unrevealing and unhelpful. Thus, she underwent complete colonoscopy 10/20/2016. The terminal ileum and colon were entirely normal. Random colon biopsies revealed changes consistent with lymphocytic colitis. She was placed on budesonide 9 mg daily. She was seen in follow-up 11/17/2016 with marked improvement in symptoms. 2-3 formed bowel movements without urgency at that time. Reported slightly higher blood sugars in the morning. No other complaints. She was continued on budesonide 9 mg daily and told follow-up at this time. Patient reports that her bowel habits continues to do well and are about as they were at her last visit. Blood sugar control has been better. No appreciable side effects or other issues except for complaints of belching.  REVIEW OF SYSTEMS:  All non-GI ROS negative except for sinus and allergy trouble  Past Medical History:  Diagnosis Date  . Allergy   . Appendicitis   . Diabetes mellitus without complication (HCC)   . Hyperlipidemia   . Hypothyroid   . Polyglandular autoimmune syndrome (HCC)   . Vitamin D deficiency     Past Surgical History:  Procedure Laterality Date  . APPENDECTOMY  07/2007  . ENDOMETRIAL ABLATION     uterine ablation  . LAPAROSCOPY     x2, ovarian cyst removal    Social History Veronica Hale  reports that she has never smoked. She has never used smokeless tobacco. She reports that she drinks alcohol. She reports that she does not use drugs.  family history includes Breast cancer in her maternal grandmother; Colon polyps in her father and mother; Diabetes in her maternal grandfather; Heart disease  in her maternal grandfather, maternal grandmother, and paternal grandfather; Hyperlipidemia in her father and mother; Irritable bowel syndrome in her mother; Skin cancer in her mother.  Allergies  Allergen Reactions  . Sulfa Antibiotics Rash    Occurred while pregnant and caused rash all over body.       PHYSICAL EXAMINATION: Vital signs: BP 108/60   Pulse 76   Ht 5' 2.5" (1.588 m)   Wt 143 lb 8 oz (65.1 kg)   BMI 25.83 kg/m  General: Well-developed, well-nourished, no acute distress Abdomen: Not reexamined Psychiatric: alert and oriented x3. Cooperative  ASSESSMENT:  #1. Lymphocytic colitis. Excellent response to budesonide which she has been on for approximately 3 months  PLAN:  #1. Taper budesonide. Decrease to 6 mg daily for one month. If tolerated decreased to 3 mg daily for one month. If tolerated discontinue. If relapse of symptoms at lower dosage may need to increase to the lowest dosage required to manage symptoms #2. Routine GI follow-up 3 months. Contact the office in the interim for any questions or problems #3. Ongoing general medical care with Dr. Evlyn Kanner

## 2017-02-23 DIAGNOSIS — E109 Type 1 diabetes mellitus without complications: Secondary | ICD-10-CM | POA: Diagnosis not present

## 2017-05-08 DIAGNOSIS — E784 Other hyperlipidemia: Secondary | ICD-10-CM | POA: Diagnosis not present

## 2017-05-08 DIAGNOSIS — K529 Noninfective gastroenteritis and colitis, unspecified: Secondary | ICD-10-CM | POA: Diagnosis not present

## 2017-05-08 DIAGNOSIS — E559 Vitamin D deficiency, unspecified: Secondary | ICD-10-CM | POA: Diagnosis not present

## 2017-05-08 DIAGNOSIS — E109 Type 1 diabetes mellitus without complications: Secondary | ICD-10-CM | POA: Diagnosis not present

## 2017-05-08 DIAGNOSIS — E038 Other specified hypothyroidism: Secondary | ICD-10-CM | POA: Diagnosis not present

## 2017-07-12 DIAGNOSIS — Z1231 Encounter for screening mammogram for malignant neoplasm of breast: Secondary | ICD-10-CM | POA: Diagnosis not present

## 2017-07-12 DIAGNOSIS — Z124 Encounter for screening for malignant neoplasm of cervix: Secondary | ICD-10-CM | POA: Diagnosis not present

## 2017-07-12 DIAGNOSIS — Z01419 Encounter for gynecological examination (general) (routine) without abnormal findings: Secondary | ICD-10-CM | POA: Diagnosis not present

## 2017-07-13 DIAGNOSIS — Z124 Encounter for screening for malignant neoplasm of cervix: Secondary | ICD-10-CM | POA: Diagnosis not present

## 2017-08-09 DIAGNOSIS — E109 Type 1 diabetes mellitus without complications: Secondary | ICD-10-CM | POA: Diagnosis not present

## 2017-08-10 DIAGNOSIS — E109 Type 1 diabetes mellitus without complications: Secondary | ICD-10-CM | POA: Diagnosis not present

## 2017-08-15 DIAGNOSIS — E310 Autoimmune polyglandular failure: Secondary | ICD-10-CM | POA: Diagnosis not present

## 2017-08-15 DIAGNOSIS — Z1389 Encounter for screening for other disorder: Secondary | ICD-10-CM | POA: Diagnosis not present

## 2017-08-15 DIAGNOSIS — K5289 Other specified noninfective gastroenteritis and colitis: Secondary | ICD-10-CM | POA: Diagnosis not present

## 2017-08-15 DIAGNOSIS — E109 Type 1 diabetes mellitus without complications: Secondary | ICD-10-CM | POA: Diagnosis not present

## 2017-09-25 DIAGNOSIS — E109 Type 1 diabetes mellitus without complications: Secondary | ICD-10-CM | POA: Diagnosis not present

## 2017-09-25 DIAGNOSIS — Z794 Long term (current) use of insulin: Secondary | ICD-10-CM | POA: Diagnosis not present

## 2017-11-28 DIAGNOSIS — E109 Type 1 diabetes mellitus without complications: Secondary | ICD-10-CM | POA: Diagnosis not present

## 2017-12-19 DIAGNOSIS — E109 Type 1 diabetes mellitus without complications: Secondary | ICD-10-CM | POA: Diagnosis not present

## 2017-12-31 DIAGNOSIS — Z794 Long term (current) use of insulin: Secondary | ICD-10-CM | POA: Diagnosis not present

## 2017-12-31 DIAGNOSIS — Z4681 Encounter for fitting and adjustment of insulin pump: Secondary | ICD-10-CM | POA: Diagnosis not present

## 2017-12-31 DIAGNOSIS — E109 Type 1 diabetes mellitus without complications: Secondary | ICD-10-CM | POA: Diagnosis not present

## 2018-01-08 DIAGNOSIS — K52839 Microscopic colitis, unspecified: Secondary | ICD-10-CM | POA: Diagnosis not present

## 2018-01-08 DIAGNOSIS — E109 Type 1 diabetes mellitus without complications: Secondary | ICD-10-CM | POA: Diagnosis not present

## 2018-01-08 DIAGNOSIS — E038 Other specified hypothyroidism: Secondary | ICD-10-CM | POA: Diagnosis not present

## 2018-03-04 DIAGNOSIS — E109 Type 1 diabetes mellitus without complications: Secondary | ICD-10-CM | POA: Diagnosis not present

## 2018-03-27 DIAGNOSIS — E109 Type 1 diabetes mellitus without complications: Secondary | ICD-10-CM | POA: Diagnosis not present

## 2018-03-29 DIAGNOSIS — E109 Type 1 diabetes mellitus without complications: Secondary | ICD-10-CM | POA: Diagnosis not present

## 2018-05-07 DIAGNOSIS — Z794 Long term (current) use of insulin: Secondary | ICD-10-CM | POA: Diagnosis not present

## 2018-05-07 DIAGNOSIS — K52839 Microscopic colitis, unspecified: Secondary | ICD-10-CM | POA: Diagnosis not present

## 2018-05-07 DIAGNOSIS — E109 Type 1 diabetes mellitus without complications: Secondary | ICD-10-CM | POA: Diagnosis not present

## 2018-05-07 DIAGNOSIS — E310 Autoimmune polyglandular failure: Secondary | ICD-10-CM | POA: Diagnosis not present

## 2018-07-26 DIAGNOSIS — E109 Type 1 diabetes mellitus without complications: Secondary | ICD-10-CM | POA: Diagnosis not present

## 2018-08-26 DIAGNOSIS — Z794 Long term (current) use of insulin: Secondary | ICD-10-CM | POA: Diagnosis not present

## 2018-08-26 DIAGNOSIS — K52839 Microscopic colitis, unspecified: Secondary | ICD-10-CM | POA: Diagnosis not present

## 2018-08-26 DIAGNOSIS — E109 Type 1 diabetes mellitus without complications: Secondary | ICD-10-CM | POA: Diagnosis not present

## 2018-09-11 DIAGNOSIS — J309 Allergic rhinitis, unspecified: Secondary | ICD-10-CM | POA: Diagnosis not present

## 2018-09-23 DIAGNOSIS — E109 Type 1 diabetes mellitus without complications: Secondary | ICD-10-CM | POA: Diagnosis not present

## 2018-09-23 DIAGNOSIS — Z794 Long term (current) use of insulin: Secondary | ICD-10-CM | POA: Diagnosis not present

## 2018-12-24 DIAGNOSIS — E109 Type 1 diabetes mellitus without complications: Secondary | ICD-10-CM | POA: Diagnosis not present

## 2018-12-25 DIAGNOSIS — E109 Type 1 diabetes mellitus without complications: Secondary | ICD-10-CM | POA: Diagnosis not present

## 2018-12-27 DIAGNOSIS — E109 Type 1 diabetes mellitus without complications: Secondary | ICD-10-CM | POA: Diagnosis not present

## 2018-12-27 DIAGNOSIS — E310 Autoimmune polyglandular failure: Secondary | ICD-10-CM | POA: Diagnosis not present

## 2018-12-27 DIAGNOSIS — Z794 Long term (current) use of insulin: Secondary | ICD-10-CM | POA: Diagnosis not present

## 2018-12-27 DIAGNOSIS — K52839 Microscopic colitis, unspecified: Secondary | ICD-10-CM | POA: Diagnosis not present

## 2020-09-22 ENCOUNTER — Other Ambulatory Visit: Payer: Self-pay | Admitting: Endocrinology

## 2020-09-22 DIAGNOSIS — R197 Diarrhea, unspecified: Secondary | ICD-10-CM

## 2020-10-01 ENCOUNTER — Other Ambulatory Visit: Payer: 59

## 2023-09-26 ENCOUNTER — Other Ambulatory Visit: Payer: Self-pay | Admitting: Obstetrics and Gynecology

## 2023-09-26 DIAGNOSIS — R928 Other abnormal and inconclusive findings on diagnostic imaging of breast: Secondary | ICD-10-CM

## 2023-10-16 ENCOUNTER — Ambulatory Visit
Admission: RE | Admit: 2023-10-16 | Discharge: 2023-10-16 | Disposition: A | Discharge status: Home / Self Care | Payer: 59 | Source: Ambulatory Visit | Attending: Obstetrics and Gynecology | Admitting: Obstetrics and Gynecology

## 2023-10-16 DIAGNOSIS — R928 Other abnormal and inconclusive findings on diagnostic imaging of breast: Secondary | ICD-10-CM
